# Patient Record
Sex: Female | Born: 1935 | Race: Black or African American | Hispanic: No | Smoking: Never smoker
Health system: Southern US, Community
[De-identification: ages and names within clinical notes are randomized; demographics above are authoritative.]

## PROBLEM LIST (undated history)

## (undated) DIAGNOSIS — E079 Disorder of thyroid, unspecified: Secondary | ICD-10-CM

## (undated) DIAGNOSIS — N289 Disorder of kidney and ureter, unspecified: Secondary | ICD-10-CM

## (undated) DIAGNOSIS — M109 Gout, unspecified: Secondary | ICD-10-CM

## (undated) DIAGNOSIS — I10 Essential (primary) hypertension: Secondary | ICD-10-CM

## (undated) HISTORY — PX: CORONARY ARTERY BYPASS GRAFT: SHX141

## (undated) HISTORY — PX: PACEMAKER INSERTION: SHX728

---

## 2015-11-16 ENCOUNTER — Emergency Department (HOSPITAL_BASED_OUTPATIENT_CLINIC_OR_DEPARTMENT_OTHER): Payer: Medicare Other

## 2015-11-16 ENCOUNTER — Encounter (HOSPITAL_BASED_OUTPATIENT_CLINIC_OR_DEPARTMENT_OTHER): Payer: Self-pay | Admitting: Emergency Medicine

## 2015-11-16 ENCOUNTER — Emergency Department (HOSPITAL_BASED_OUTPATIENT_CLINIC_OR_DEPARTMENT_OTHER)
Admission: EM | Admit: 2015-11-16 | Discharge: 2015-11-17 | Disposition: A | Discharge status: Home / Self Care | Payer: Medicare Other | Attending: Emergency Medicine | Admitting: Emergency Medicine

## 2015-11-16 DIAGNOSIS — Z951 Presence of aortocoronary bypass graft: Secondary | ICD-10-CM | POA: Insufficient documentation

## 2015-11-16 DIAGNOSIS — I10 Essential (primary) hypertension: Secondary | ICD-10-CM | POA: Insufficient documentation

## 2015-11-16 DIAGNOSIS — E079 Disorder of thyroid, unspecified: Secondary | ICD-10-CM | POA: Insufficient documentation

## 2015-11-16 DIAGNOSIS — M109 Gout, unspecified: Secondary | ICD-10-CM | POA: Diagnosis not present

## 2015-11-16 DIAGNOSIS — Z87448 Personal history of other diseases of urinary system: Secondary | ICD-10-CM | POA: Insufficient documentation

## 2015-11-16 DIAGNOSIS — Z950 Presence of cardiac pacemaker: Secondary | ICD-10-CM | POA: Diagnosis not present

## 2015-11-16 DIAGNOSIS — J209 Acute bronchitis, unspecified: Secondary | ICD-10-CM | POA: Diagnosis not present

## 2015-11-16 DIAGNOSIS — R05 Cough: Secondary | ICD-10-CM | POA: Diagnosis present

## 2015-11-16 DIAGNOSIS — Z79899 Other long term (current) drug therapy: Secondary | ICD-10-CM | POA: Diagnosis not present

## 2015-11-16 HISTORY — DX: Gout, unspecified: M10.9

## 2015-11-16 HISTORY — DX: Disorder of kidney and ureter, unspecified: N28.9

## 2015-11-16 HISTORY — DX: Disorder of thyroid, unspecified: E07.9

## 2015-11-16 HISTORY — DX: Essential (primary) hypertension: I10

## 2015-11-16 LAB — BASIC METABOLIC PANEL
Anion gap: 12 (ref 5–15)
BUN: 37 mg/dL — AB (ref 6–20)
CHLORIDE: 105 mmol/L (ref 101–111)
CO2: 22 mmol/L (ref 22–32)
CREATININE: 1.77 mg/dL — AB (ref 0.44–1.00)
Calcium: 8.9 mg/dL (ref 8.9–10.3)
GFR calc Af Amer: 28 mL/min — ABNORMAL LOW (ref >60)
GFR calc non Af Amer: 24 mL/min — ABNORMAL LOW (ref >60)
Glucose, Bld: 156 mg/dL — ABNORMAL HIGH (ref 65–99)
POTASSIUM: 4.7 mmol/L (ref 3.5–5.1)
Sodium: 139 mmol/L (ref 135–145)

## 2015-11-16 LAB — CBC
HEMATOCRIT: 35.8 % — AB (ref 36.0–46.0)
HEMOGLOBIN: 11.8 g/dL — AB (ref 12.0–15.0)
MCH: 29.6 pg (ref 26.0–34.0)
MCHC: 33 g/dL (ref 30.0–36.0)
MCV: 89.9 fL (ref 78.0–100.0)
Platelets: 144 10*3/uL — ABNORMAL LOW (ref 150–400)
RBC: 3.98 MIL/uL (ref 3.87–5.11)
RDW: 16.3 % — ABNORMAL HIGH (ref 11.5–15.5)
WBC: 4.9 10*3/uL (ref 4.0–10.5)

## 2015-11-16 MED ORDER — IPRATROPIUM-ALBUTEROL 0.5-2.5 (3) MG/3ML IN SOLN
RESPIRATORY_TRACT | Status: AC
  Administered 2015-11-16: 3 mL via RESPIRATORY_TRACT
  Filled 2015-11-16: qty 3

## 2015-11-16 MED ORDER — ALBUTEROL SULFATE HFA 108 (90 BASE) MCG/ACT IN AERS
INHALATION_SPRAY | RESPIRATORY_TRACT | Status: AC
  Administered 2015-11-17: 2 via RESPIRATORY_TRACT
  Filled 2015-11-16: qty 6.7

## 2015-11-16 MED ORDER — IPRATROPIUM-ALBUTEROL 0.5-2.5 (3) MG/3ML IN SOLN
3.0000 mL | RESPIRATORY_TRACT | Status: DC
  Administered 2015-11-16: 3 mL via RESPIRATORY_TRACT

## 2015-11-16 NOTE — ED Notes (Signed)
Patient states that she has had a cough x 2 -3 days. Reports that it is not getting any better at this time.

## 2015-11-16 NOTE — ED Provider Notes (Signed)
CSN: [id number]     Arrival date & time 11/16/15  2014 History  By signing my name below, I, Budd Palmer, attest that this documentation has been prepared under the direction and in the presence of Paula Libra, MD. Electronically Signed: Budd Palmer, ED Scribe. 11/16/2015. 11:12 PM.      Chief Complaint  Patient presents with  . Cough   The history is provided by the patient. No language interpreter was used.   HPI Comments: Nina Krause is a 82 y.o. female who presents to the Emergency Department complaining of a persistent, non-productive cough onset 3 days ago. Per daughter, she is concerned due to pt not improving and having SOB. She reports pt c/o associated congestion and generalized weakness. Pt has taken Robitussin, Mucinex, and liquid Tylenol Cold and Flu without relief. She denies pt having fever. Symptoms are moderate.  Past Medical History  Diagnosis Date  . Hypertension   . Thyroid disease   . Gout   . Renal disorder    Past Surgical History  Procedure Laterality Date  . Coronary artery bypass graft    . Pacemaker insertion     History reviewed. No pertinent family history. Social History  Substance Use Topics  . Smoking status: Never Smoker   . Smokeless tobacco: None  . Alcohol Use: No   OB History    No data available     Review of Systems  All other systems reviewed and are negative.   Allergies  Review of patient's allergies indicates no known allergies.  Home Medications   Prior to Admission medications   Medication Sig Start Date End Date Taking? Authorizing Provider  allopurinol (ZYLOPRIM) 100 MG tablet Take 100 mg by mouth 2 (two) times daily.   Yes Historical Provider, MD  carvedilol (COREG) 3.125 MG tablet Take 3.125 mg by mouth 3 (three) times daily.   Yes Historical Provider, MD  colchicine 0.6 MG tablet Take 0.6 mg by mouth daily.   Yes Historical Provider, MD  donepezil (ARICEPT) 10 MG tablet Take 10 mg by mouth at bedtime.   Yes  Historical Provider, MD  levothyroxine (SYNTHROID, LEVOTHROID) 50 MCG tablet Take 50 mcg by mouth daily before breakfast.   Yes Historical Provider, MD  mirtazapine (REMERON) 30 MG tablet Take 30 mg by mouth at bedtime.   Yes Historical Provider, MD  Multiple Vitamin (MULTIVITAMIN) LIQD Take 5 mLs by mouth daily.   Yes Historical Provider, MD   BP 194/96 mmHg  Pulse 65  Temp(Src) 97.8 F (36.6 C) (Oral)  Resp 18  Ht  (1.676 m)  Wt 160 lb (72.576 kg)  BMI 25.84 kg/m2  SpO2 98%   Physical Exam General: Well-developed, well-nourished female in no acute distress; appearance consistent with age of record HENT: normocephalic; atraumatic Eyes: pupils equal, round and reactive to light; extraocular muscles intact; arcus senilis bilaterally Neck: supple Heart: regular rate and rhythm Lungs: decreased air movement bilaterally without wheezes; coughing with deep breathing Abdomen: soft; nondistended; nontender; no masses or hepatosplenomegaly; bowel sounds present Extremities: No deformity; full range of motion; pulses normal Neurologic: Awake, alert and oriented; motor function intact in all extremities and symmetric; no facial droop Skin: Warm and dry Psychiatric: Normal mood and affect  ED Course  Procedures   MDM  12:01 AM Movement improved after DuoNeb treatment. We'll provide patient with an inhaler. Her daughter, who is a Engineer, civil (consulting), would also like to be able to give her neb treatments at home.    Jonny Ruiz  Muhammad Vacca, MD 11/17/15 0002

## 2015-11-17 MED ORDER — ALBUTEROL SULFATE HFA 108 (90 BASE) MCG/ACT IN AERS
2.0000 | INHALATION_SPRAY | RESPIRATORY_TRACT | Status: DC | PRN
  Administered 2015-11-17: 2 via RESPIRATORY_TRACT

## 2015-11-17 MED ORDER — DOXYCYCLINE HYCLATE 100 MG PO CAPS: 100.0000 mg | ORAL_CAPSULE | Freq: Two times a day (BID) | ORAL | Status: AC

## 2015-11-17 MED ORDER — ALBUTEROL SULFATE (2.5 MG/3ML) 0.083% IN NEBU: 2.5000 mg | INHALATION_SOLUTION | RESPIRATORY_TRACT | Status: AC | PRN

## 2015-11-17 MED ORDER — DOXYCYCLINE HYCLATE 100 MG PO TABS
100.0000 mg | ORAL_TABLET | Freq: Once | ORAL | Status: AC
  Administered 2015-11-17: 100 mg via ORAL
  Filled 2015-11-17: qty 1

## 2015-11-17 NOTE — Discharge Instructions (Signed)

## 2019-10-06 ENCOUNTER — Ambulatory Visit: Payer: Medicare Other

## 2019-10-10 ENCOUNTER — Encounter (HOSPITAL_BASED_OUTPATIENT_CLINIC_OR_DEPARTMENT_OTHER): Payer: Self-pay | Admitting: *Deleted

## 2019-10-10 ENCOUNTER — Emergency Department (HOSPITAL_BASED_OUTPATIENT_CLINIC_OR_DEPARTMENT_OTHER): Payer: Medicare PPO

## 2019-10-10 ENCOUNTER — Emergency Department (HOSPITAL_BASED_OUTPATIENT_CLINIC_OR_DEPARTMENT_OTHER)
Admission: EM | Admit: 2019-10-10 | Discharge: 2019-10-10 | Disposition: A | Discharge status: Home / Self Care | Payer: Medicare PPO | Attending: Emergency Medicine | Admitting: Emergency Medicine

## 2019-10-10 ENCOUNTER — Other Ambulatory Visit: Payer: Self-pay

## 2019-10-10 DIAGNOSIS — Z950 Presence of cardiac pacemaker: Secondary | ICD-10-CM | POA: Insufficient documentation

## 2019-10-10 DIAGNOSIS — Z951 Presence of aortocoronary bypass graft: Secondary | ICD-10-CM | POA: Insufficient documentation

## 2019-10-10 DIAGNOSIS — I10 Essential (primary) hypertension: Secondary | ICD-10-CM | POA: Insufficient documentation

## 2019-10-10 DIAGNOSIS — S0990XA Unspecified injury of head, initial encounter: Secondary | ICD-10-CM

## 2019-10-10 DIAGNOSIS — Y93I9 Activity, other involving external motion: Secondary | ICD-10-CM | POA: Diagnosis not present

## 2019-10-10 DIAGNOSIS — Y92410 Unspecified street and highway as the place of occurrence of the external cause: Secondary | ICD-10-CM | POA: Insufficient documentation

## 2019-10-10 DIAGNOSIS — M545 Low back pain, unspecified: Secondary | ICD-10-CM

## 2019-10-10 DIAGNOSIS — Y999 Unspecified external cause status: Secondary | ICD-10-CM | POA: Insufficient documentation
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
## 2021-01-04 DEATH — deceased
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
